# Patient Record
Sex: Female | Born: 1945 | Race: Black or African American | Hispanic: No | Marital: Married | Smoking: Former smoker
Health system: Southern US, Community
[De-identification: ages and names within clinical notes are randomized; demographics above are authoritative.]

## PROBLEM LIST (undated history)

## (undated) DIAGNOSIS — I10 Essential (primary) hypertension: Secondary | ICD-10-CM

## (undated) DIAGNOSIS — N19 Unspecified kidney failure: Secondary | ICD-10-CM

## (undated) DIAGNOSIS — I639 Cerebral infarction, unspecified: Secondary | ICD-10-CM

## (undated) HISTORY — PX: ABDOMINAL HYSTERECTOMY: SHX81

---

## 2000-02-18 ENCOUNTER — Emergency Department (HOSPITAL_COMMUNITY): Admission: EM | Admit: 2000-02-18 | Discharge: 2000-02-18 | Payer: Self-pay

## 2016-10-25 ENCOUNTER — Inpatient Hospital Stay: Admit: 2016-10-25 | Payer: Self-pay | Admitting: General Surgery

## 2016-10-25 SURGERY — LAPAROSCOPIC ROUX-EN-Y GASTRIC BYPASS WITH UPPER ENDOSCOPY
Anesthesia: General

## 2017-03-31 ENCOUNTER — Observation Stay (HOSPITAL_COMMUNITY)
Admission: EM | Admit: 2017-03-31 | Discharge: 2017-04-01 | Disposition: A | Discharge status: Home / Self Care | Payer: Medicare Other | Attending: Internal Medicine | Admitting: Internal Medicine

## 2017-03-31 ENCOUNTER — Emergency Department (HOSPITAL_COMMUNITY): Payer: Medicare Other

## 2017-03-31 ENCOUNTER — Encounter (HOSPITAL_COMMUNITY): Payer: Self-pay | Admitting: Nurse Practitioner

## 2017-03-31 DIAGNOSIS — M549 Dorsalgia, unspecified: Secondary | ICD-10-CM | POA: Diagnosis present

## 2017-03-31 DIAGNOSIS — N185 Chronic kidney disease, stage 5: Secondary | ICD-10-CM

## 2017-03-31 DIAGNOSIS — I120 Hypertensive chronic kidney disease with stage 5 chronic kidney disease or end stage renal disease: Secondary | ICD-10-CM | POA: Diagnosis not present

## 2017-03-31 DIAGNOSIS — I160 Hypertensive urgency: Principal | ICD-10-CM | POA: Insufficient documentation

## 2017-03-31 DIAGNOSIS — R778 Other specified abnormalities of plasma proteins: Secondary | ICD-10-CM | POA: Insufficient documentation

## 2017-03-31 DIAGNOSIS — Z992 Dependence on renal dialysis: Secondary | ICD-10-CM | POA: Diagnosis not present

## 2017-03-31 DIAGNOSIS — N186 End stage renal disease: Secondary | ICD-10-CM | POA: Diagnosis present

## 2017-03-31 DIAGNOSIS — S22060A Wedge compression fracture of T7-T8 vertebra, initial encounter for closed fracture: Secondary | ICD-10-CM | POA: Insufficient documentation

## 2017-03-31 DIAGNOSIS — Z7982 Long term (current) use of aspirin: Secondary | ICD-10-CM | POA: Diagnosis not present

## 2017-03-31 DIAGNOSIS — K8020 Calculus of gallbladder without cholecystitis without obstruction: Secondary | ICD-10-CM | POA: Insufficient documentation

## 2017-03-31 DIAGNOSIS — K5730 Diverticulosis of large intestine without perforation or abscess without bleeding: Secondary | ICD-10-CM | POA: Diagnosis not present

## 2017-03-31 DIAGNOSIS — Z79899 Other long term (current) drug therapy: Secondary | ICD-10-CM | POA: Insufficient documentation

## 2017-03-31 DIAGNOSIS — R7989 Other specified abnormal findings of blood chemistry: Secondary | ICD-10-CM

## 2017-03-31 DIAGNOSIS — D631 Anemia in chronic kidney disease: Secondary | ICD-10-CM | POA: Diagnosis not present

## 2017-03-31 DIAGNOSIS — I10 Essential (primary) hypertension: Secondary | ICD-10-CM | POA: Diagnosis present

## 2017-03-31 DIAGNOSIS — Z8673 Personal history of transient ischemic attack (TIA), and cerebral infarction without residual deficits: Secondary | ICD-10-CM | POA: Insufficient documentation

## 2017-03-31 DIAGNOSIS — I313 Pericardial effusion (noninflammatory): Secondary | ICD-10-CM | POA: Diagnosis not present

## 2017-03-31 DIAGNOSIS — Z90710 Acquired absence of both cervix and uterus: Secondary | ICD-10-CM | POA: Insufficient documentation

## 2017-03-31 DIAGNOSIS — X58XXXS Exposure to other specified factors, sequela: Secondary | ICD-10-CM | POA: Diagnosis not present

## 2017-03-31 DIAGNOSIS — I2510 Atherosclerotic heart disease of native coronary artery without angina pectoris: Secondary | ICD-10-CM | POA: Insufficient documentation

## 2017-03-31 DIAGNOSIS — Z7902 Long term (current) use of antithrombotics/antiplatelets: Secondary | ICD-10-CM | POA: Insufficient documentation

## 2017-03-31 DIAGNOSIS — I700 Atherosclerosis of aorta: Secondary | ICD-10-CM | POA: Diagnosis not present

## 2017-03-31 DIAGNOSIS — Z87891 Personal history of nicotine dependence: Secondary | ICD-10-CM | POA: Diagnosis not present

## 2017-03-31 DIAGNOSIS — I083 Combined rheumatic disorders of mitral, aortic and tricuspid valves: Secondary | ICD-10-CM | POA: Diagnosis not present

## 2017-03-31 DIAGNOSIS — R748 Abnormal levels of other serum enzymes: Secondary | ICD-10-CM | POA: Diagnosis present

## 2017-03-31 DIAGNOSIS — M546 Pain in thoracic spine: Secondary | ICD-10-CM

## 2017-03-31 HISTORY — DX: Essential (primary) hypertension: I10

## 2017-03-31 HISTORY — DX: Cerebral infarction, unspecified: I63.9

## 2017-03-31 HISTORY — DX: Unspecified kidney failure: N19

## 2017-03-31 LAB — BASIC METABOLIC PANEL
Anion gap: 10 (ref 5–15)
BUN: 20 mg/dL (ref 6–20)
CO2: 29 mmol/L (ref 22–32)
Calcium: 9.7 mg/dL (ref 8.9–10.3)
Chloride: 100 mmol/L — ABNORMAL LOW (ref 101–111)
Creatinine, Ser: 3.86 mg/dL — ABNORMAL HIGH (ref 0.44–1.00)
GFR calc Af Amer: 13 mL/min — ABNORMAL LOW (ref >60)
GFR calc non Af Amer: 11 mL/min — ABNORMAL LOW (ref >60)
Glucose, Bld: 117 mg/dL — ABNORMAL HIGH (ref 65–99)
Potassium: 4.6 mmol/L (ref 3.5–5.1)
Sodium: 139 mmol/L (ref 135–145)

## 2017-03-31 LAB — CBC WITH DIFFERENTIAL/PLATELET
Basophils Absolute: 0 10*3/uL (ref 0.0–0.1)
Basophils Relative: 0 %
Eosinophils Absolute: 0.4 10*3/uL (ref 0.0–0.7)
Eosinophils Relative: 4 %
HCT: 28.1 % — ABNORMAL LOW (ref 36.0–46.0)
Hemoglobin: 9.5 g/dL — ABNORMAL LOW (ref 12.0–15.0)
Lymphocytes Relative: 23 %
Lymphs Abs: 2.2 10*3/uL (ref 0.7–4.0)
MCH: 30.3 pg (ref 26.0–34.0)
MCHC: 33.8 g/dL (ref 30.0–36.0)
MCV: 89.5 fL (ref 78.0–100.0)
Monocytes Absolute: 1 10*3/uL (ref 0.1–1.0)
Monocytes Relative: 10 %
Neutro Abs: 6 10*3/uL (ref 1.7–7.7)
Neutrophils Relative %: 63 %
Platelets: 191 10*3/uL (ref 150–400)
RBC: 3.14 MIL/uL — ABNORMAL LOW (ref 3.87–5.11)
RDW: 15.4 % (ref 11.5–15.5)
WBC: 9.6 10*3/uL (ref 4.0–10.5)

## 2017-03-31 MED ORDER — MORPHINE SULFATE (PF) 4 MG/ML IV SOLN
4.0000 mg | Freq: Once | INTRAVENOUS | Status: AC
  Administered 2017-03-31: 4 mg via INTRAVENOUS
  Filled 2017-03-31: qty 1

## 2017-03-31 MED ORDER — IOPAMIDOL (ISOVUE-370) INJECTION 76%
INTRAVENOUS | Status: AC
  Filled 2017-03-31: qty 100

## 2017-03-31 MED ORDER — FUROSEMIDE 40 MG PO TABS
40.0000 mg | ORAL_TABLET | Freq: Once | ORAL | Status: AC
  Administered 2017-03-31: 40 mg via ORAL
  Filled 2017-03-31: qty 1

## 2017-03-31 MED ORDER — CARVEDILOL 25 MG PO TABS
25.0000 mg | ORAL_TABLET | Freq: Once | ORAL | Status: AC
  Administered 2017-04-01: 25 mg via ORAL
  Filled 2017-03-31 (×2): qty 1

## 2017-03-31 MED ORDER — IOPAMIDOL (ISOVUE-370) INJECTION 76%
100.0000 mL | Freq: Once | INTRAVENOUS | Status: AC | PRN
  Administered 2017-03-31: 100 mL via INTRAVENOUS

## 2017-03-31 NOTE — ED Provider Notes (Signed)
[facility]-EMERGENCY DEPT Provider Note   CSN: [id number] Arrival date & time: 03/31/17  2003   History   Chief Complaint Chief Complaint  Patient presents with  . Back Pain    HPI Brooke Delacruz is a 71 y.o. female.  HPI   71 year old female with a history of hypertension, renal failure presents today with complaints of back pain.  She notes that over the last week she has had coming and going pain in her back.  She describes this as a dull ache but can be severe at times.  She notes the last several minutes and then resolves completely.  Patient denies any heavy lifting.  Patient reports this pain radiates into her left arm.  She denies any history of the same.  She reports symptoms are not made worse with any upper strengthening her torso movements, deep inspiration, or positioning.  She denies any radiation into her chest or abdomen, denies any associated shortness of breath.   Patient reports a history of aortic valve problems, but is unable to provide significant details surrounding this.  She denies any history of cardiac dysfunction other than valvular, reports that she did have a minor stroke previously.  She reports her hypertension has been "up and down", and is currently being managed by her dialysis center.  She reports she is a Monday Wednesday Friday patient had dialysis today.   Past Medical History:  Diagnosis Date  . Hypertension   . Renal failure     There are no active problems to display for this patient.   Past Surgical History:  Procedure Laterality Date  . ABDOMINAL HYSTERECTOMY      OB History    No data available       Home Medications    Prior to Admission medications   Not on File    Family History No family history on file.  Social History Social History  Substance Use Topics  . Smoking status: Never Smoker  . Smokeless tobacco: Not on file  . Alcohol use Yes     Allergies   Patient has no known allergies.   Review of  Systems Review of Systems  All other systems reviewed and are negative.    Physical Exam Updated Vital Signs BP (!) 220/40 (BP Location: Right Arm)   Pulse 70   Temp 97.7 F (36.5 C) (Oral)   Resp 18   SpO2 98%   Physical Exam  Constitutional: She is oriented to person, place, and time. She appears well-developed and well-nourished.  HENT:  Head: Normocephalic and atraumatic.  Eyes: Conjunctivae are normal. Pupils are equal, round, and reactive to light. Right eye exhibits no discharge. Left eye exhibits no discharge. No scleral icterus.  Neck: Normal range of motion. No JVD present. No tracheal deviation present.  Cardiovascular: Normal rate and regular rhythm.   Murmur heard. Upper extremity pulses intact, right greater than left; systolic murmur  Pulmonary/Chest: Effort normal and breath sounds normal. No stridor. No respiratory distress. She has no wheezes. She has no rales. She exhibits no tenderness.  Abdominal: Soft. She exhibits no distension and no mass. There is no tenderness. There is no rebound and no guarding. No hernia.  Musculoskeletal:  No CT or L-spine tenderness to palpation.  No pain to the cervical spine with axial loading.  Surrounding musculature nontender to palpation full active range of motion of upper extremities.  Neurological: She is alert and oriented to person, place, and time. Coordination normal.  Psychiatric: She has a  normal mood and affect. Her behavior is normal. Judgment and thought content normal.  Nursing note and vitals reviewed.    ED Treatments / Results  Labs (all labs ordered are listed, but only abnormal results are displayed) Labs Reviewed  CBC WITH DIFFERENTIAL/PLATELET - Abnormal; Notable for the following:       Result Value   RBC 3.14 (*)    Hemoglobin 9.5 (*)    HCT 28.1 (*)    All other components within normal limits  BASIC METABOLIC PANEL - Abnormal; Notable for the following:    Chloride 100 (*)    Glucose, Bld 117  (*)    Creatinine, Ser 3.86 (*)    GFR calc non Af Amer 11 (*)    GFR calc Af Amer 13 (*)    All other components within normal limits  I-STAT TROPOININ, ED    EKG  EKG Interpretation  Date/Time:  Friday March 31 2017 23:46:56 EDT Ventricular Rate:  62 PR Interval:    QRS Duration: 88 QT Interval:  434 QTC Calculation: 441 R Axis:   64 Text Interpretation:  Sinus rhythm Consider left ventricular hypertrophy No old tracing to compare Confirmed by Rolan Bucco [phone]) on 03/31/2017 11:51:21 PM       Radiology No results found.  Procedures Procedures (including critical care time)  Medications Ordered in ED Medications  carvedilol (COREG) tablet 25 mg (not administered)  iopamidol (ISOVUE-370) 76 % injection (not administered)  furosemide (LASIX) tablet 40 mg (40 mg Oral Given 03/31/17 2333)  morphine 4 MG/ML injection 4 mg (4 mg Intravenous Given 03/31/17 2333)  iopamidol (ISOVUE-370) 76 % injection 100 mL (100 mLs Intravenous Contrast Given 03/31/17 2349)     Initial Impression / Assessment and Plan / ED Course  I have reviewed the triage vital signs and the nursing notes.  Pertinent labs & imaging results that were available during my care of the patient were reviewed by me and considered in my medical decision making (see chart for details).  Clinical Course as of Apr 01 2  Fri Mar 31, 2017  2334 Plan: labs, CTA chest.  If labs, ECG and Ct are negative she can be d/c home.    [HM]    Clinical Course User Index [HM] Muthersbaugh, Dahlia Client, PA-C     Final Clinical Impressions(s) / ED Diagnoses   Final diagnoses:  Acute midline thoracic back pain    71 year old female presents today with complaints of back pain.  She was initially asymptomatic at the time of evaluation, but did have an episode while here in the ED that caused her to be tearful.  She will be given a dose of morphine for this.  Patient has not taken her home hypertensive medications, she will be  given these.  Patient has had troubles managing her blood pressure at home which has resulted in her renal failure.  Patient's pain does not seem musculoskeletal in nature, low suspicion for pulmonary, low suspicion for ACS or PE.  Patient will have CT scan of the chest abdomen and pelvis for dissection.  Patient will be signed out to oncoming provider pending dissection study and disposition.  New Prescriptions New Prescriptions   No medications on file     Rosalio Loud 04/01/17 0003    Rolan Bucco, MD 04/01/17 [phone]

## 2017-03-31 NOTE — ED Triage Notes (Signed)
Pt is c/o upper back pain/scapula aspect of her back that she describes as dull and radiating to bilateral upper extremities. Denies any sort of trauma, reports onset on a week from today.

## 2017-03-31 NOTE — ED Provider Notes (Signed)
Care assumed from [place], PA-C and Rolan Bucco, MD.  Brooke Delacruz is a 71 y.o. female presents from out of town with a hx of renal failure (MWF dialysis - done today without complication), CVA, systolic murmur c/o intermittent mid thoracic back pain that radiates into her left shoulder and arm.  Pt is hypertensive here, with hx of the same.  Also hx of valve abnormality.  No hx of chrnic back pain.   Physical Exam  BP (!) 235/55 (BP Location: Left Arm)   Pulse 70   Temp 97.7 F (36.5 C) (Oral)   Resp 18   SpO2 98%   Physical Exam  Constitutional: She appears well-developed and well-nourished.  Uncomfortable appearing  HENT:  Head: Normocephalic.  Eyes: Conjunctivae are normal. No scleral icterus.  Neck: Normal range of motion.  Cardiovascular: Normal rate and intact distal pulses.   Pulmonary/Chest: Effort normal.  Musculoskeletal: Normal range of motion.  Neurological: She is alert.  Skin: Skin is warm and dry.  Nursing note and vitals reviewed.     EKG Interpretation  Date/Time:  Friday March 31 2017 23:46:56 EDT Ventricular Rate:  62 PR Interval:    QRS Duration: 88 QT Interval:  434 QTC Calculation: 441 R Axis:   64 Text Interpretation:  Sinus rhythm Consider left ventricular hypertrophy No old tracing to compare Confirmed by Rolan Bucco [phone]) on 03/31/2017 11:51:21 PM      Results for orders placed or performed during the hospital encounter of 03/31/17  CBC with Differential  Result Value Ref Range   WBC 9.6 4.0 - 10.5 K/uL   RBC 3.14 (L) 3.87 - 5.11 MIL/uL   Hemoglobin 9.5 (L) 12.0 - 15.0 g/dL   HCT 60.4 (L) 54.0 - 98.1 %   MCV 89.5 78.0 - 100.0 fL   MCH 30.3 26.0 - 34.0 pg   MCHC 33.8 30.0 - 36.0 g/dL   RDW 19.1 47.8 - 29.5 %   Platelets 191 150 - 400 K/uL   Neutrophils Relative % 63 %   Neutro Abs 6.0 1.7 - 7.7 K/uL   Lymphocytes Relative 23 %   Lymphs Abs 2.2 0.7 - 4.0 K/uL   Monocytes Relative 10 %   Monocytes Absolute 1.0 0.1 - 1.0 K/uL    Eosinophils Relative 4 %   Eosinophils Absolute 0.4 0.0 - 0.7 K/uL   Basophils Relative 0 %   Basophils Absolute 0.0 0.0 - 0.1 K/uL  Basic metabolic panel  Result Value Ref Range   Sodium 139 135 - 145 mmol/L   Potassium 4.6 3.5 - 5.1 mmol/L   Chloride 100 (L) 101 - 111 mmol/L   CO2 29 22 - 32 mmol/L   Glucose, Bld 117 (H) 65 - 99 mg/dL   BUN 20 6 - 20 mg/dL   Creatinine, Ser 6.21 (H) 0.44 - 1.00 mg/dL   Calcium 9.7 8.9 - 30.8 mg/dL   GFR calc non Af Amer 11 (L) >60 mL/min   GFR calc Af Amer 13 (L) >60 mL/min   Anion gap 10 5 - 15  I-Stat Troponin, ED (not at [facility])  Result Value Ref Range   Troponin i, poc 0.10 (HH) 0.00 - 0.08 ng/mL   Comment NOTIFIED PHYSICIAN    Comment 3           Ct Angio Chest/abd/pel For Dissection W And/or Wo Contrast  Result Date: 04/01/2017 CLINICAL DATA:  Back pain for 1 week. Patient with chronic renal failure on dialysis, most recently this morning.  EXAM: CT ANGIOGRAPHY CHEST, ABDOMEN AND PELVIS TECHNIQUE: Multidetector CT imaging through the chest, abdomen and pelvis was performed using the standard protocol during bolus administration of intravenous contrast. Multiplanar reconstructed images and MIPs were obtained and reviewed to evaluate the vascular anatomy. CONTRAST:  100 cc Isovue 370 IV COMPARISON:  None.  No prior imaging available. FINDINGS: CTA CHEST FINDINGS Cardiovascular: Atherosclerosis of thoracic aorta without dissection, aneurysm or perihepatic hematoma. Cardiac motion artifact partially obscures detailed evaluation. No central pulmonary embolus. Multi chamber cardiomegaly. Moderate pericardial effusion adjacent to the left ventricle measures up to 27 mm. There are coronary artery calcifications. Mediastinum/Nodes: No mediastinal or hilar adenopathy. The esophagus is decompressed. Thyroid gland is heterogeneous. Lungs/Pleura: Linear atelectasis in the right lower and middle lobes. Compressive atelectasis in the left lower lobe adjacent to  pericardial fluid. No consolidation. No pulmonary edema. No pleural effusion. Musculoskeletal: Degenerative disc disease in the thoracic spine. Mild anterior wedging of T7 vertebral body with depression of the superior endplate. Loss of height loss in 20%. Sternum and ribs are intact. Review of the MIP images confirms the above findings. CTA ABDOMEN AND PELVIS FINDINGS VASCULAR Aorta: Normal caliber aorta without aneurysm, dissection, vasculitis or significant stenosis. Moderate diffuse atherosclerosis. Celiac: Minimal stenosis at the origins secondary to plaque. No evidence of aneurysm caught vasculitis or dissection. SMA: Patent without evidence of aneurysm, dissection, vasculitis or significant stenosis. Moderate diffuse atherosclerosis. Renals: Stent at the origin of the left renal artery. Two right renal arteries that are patent. IMA: Patent without evidence of aneurysm, dissection, vasculitis or significant stenosis. Inflow: Diffuse atheromatous plaque. No aneurysm or dissection. Narrowing at the bilateral common femoral artery is. Bilateral common femoral stents are partially included and not well evaluated. Veins: No obvious venous abnormality within the limitations of this arterial phase study. Review of the MIP images confirms the above findings. NON-VASCULAR Hepatobiliary: No focal hepatic lesion allowing for arterial phase enhancement. Gallstones within physiologically distended gallbladder. No biliary dilatation. Pancreas: Parenchymal atrophy. No ductal dilatation or inflammation. Spleen: Normal arterial phase enhancement. Adrenals/Urinary Tract: No adrenal nodule. Atrophic bilateral renal parenchyma, patient on hemodialysis. There is a 2.1 cm cyst in the mid right kidney. Heterogeneous left kidney likely due to small cysts. Urinary bladder is physiologically distended. Stomach/Bowel: Colonic diverticulosis without acute inflammation. No bowel obstruction. Appendix is not visualized. Questionable  enteric sutures in the central small bowel. Lymphatic: No adenopathy. Reproductive: Uterus is surgically absent. Question of cystic changes in both adnexa, measuring up to 5 cm on the right and 3 cm on the left. Other: No ascites. No free air. Postsurgical change in the anterior abdominal wall. Musculoskeletal: Degenerative change in the lumbar spine without acute abnormality. Review of the MIP images confirms the above findings. IMPRESSION: 1. No aortic dissection or acute aortic abnormality. Diffuse atheromatous calcifications throughout the thoracic and abdominal aorta. 2. Minimal anterior wedge compression fracture of T7 vertebral body, age indeterminate, may be cause of patient's back pain. 3. Cardiomegaly with moderate pericardial effusion. Coronary artery calcifications. 4. Uncomplicated cholelithiasis. 5. Cystic changes in both adnexa, measuring up to 5 cm on the right and 3 cm on the left. Recommend nonemergent characterization with ultrasound on an elective basis. Comparison with any prior imaging if available may be helpful if available to evaluate for imaging stability. 6. Colonic diverticulosis. Aortic Atherosclerosis (ICD10-I70.0). Electronically Signed   By: Rubye OaksMelanie  Ehinger M.D.   On: 04/01/2017 00:21     ED Course  Procedures  Clinical Course as of Mar 31 2336  Fri Mar 31, 2017  2334 Plan: labs, CTA chest.  If labs, ECG and Ct are negative she can be d/c home.    [HM]    Clinical Course User Index [HM] Avani Sensabaugh, Dahlia Client, PA-C    MDM Pt with continued HTN, back pain and now with elevated troponin.  This may be baseline for pt, but there are no old records for comparison.  Discussed with Dr. Maryfrances Bunnell who will admit.           Zebbie Ace, Boyd Kerbs 04/01/17 0121    Rolan Bucco, MD 04/01/17 [phone]

## 2017-03-31 NOTE — ED Notes (Signed)
Pt is alert and oriented x 4 and is verbally responsive. Pt reports having back pain x 1 week that is described as a dull ache that is intermittent. Pt states that she has not taking any medication for pain. Pt denies pain at this time. Pt states that he gerts HD 3 x a week.

## 2017-04-01 ENCOUNTER — Encounter (HOSPITAL_COMMUNITY): Payer: Self-pay | Admitting: Family Medicine

## 2017-04-01 ENCOUNTER — Observation Stay (HOSPITAL_BASED_OUTPATIENT_CLINIC_OR_DEPARTMENT_OTHER): Payer: Medicare Other

## 2017-04-01 DIAGNOSIS — R778 Other specified abnormalities of plasma proteins: Secondary | ICD-10-CM | POA: Diagnosis present

## 2017-04-01 DIAGNOSIS — I360 Nonrheumatic tricuspid (valve) stenosis: Secondary | ICD-10-CM

## 2017-04-01 DIAGNOSIS — Z992 Dependence on renal dialysis: Secondary | ICD-10-CM

## 2017-04-01 DIAGNOSIS — M549 Dorsalgia, unspecified: Secondary | ICD-10-CM

## 2017-04-01 DIAGNOSIS — R748 Abnormal levels of other serum enzymes: Secondary | ICD-10-CM

## 2017-04-01 DIAGNOSIS — I313 Pericardial effusion (noninflammatory): Secondary | ICD-10-CM

## 2017-04-01 DIAGNOSIS — N185 Chronic kidney disease, stage 5: Secondary | ICD-10-CM | POA: Diagnosis not present

## 2017-04-01 DIAGNOSIS — D631 Anemia in chronic kidney disease: Secondary | ICD-10-CM | POA: Diagnosis not present

## 2017-04-01 DIAGNOSIS — I10 Essential (primary) hypertension: Secondary | ICD-10-CM | POA: Diagnosis not present

## 2017-04-01 DIAGNOSIS — I160 Hypertensive urgency: Secondary | ICD-10-CM | POA: Diagnosis not present

## 2017-04-01 DIAGNOSIS — N186 End stage renal disease: Secondary | ICD-10-CM | POA: Diagnosis not present

## 2017-04-01 DIAGNOSIS — R7989 Other specified abnormal findings of blood chemistry: Secondary | ICD-10-CM

## 2017-04-01 DIAGNOSIS — M546 Pain in thoracic spine: Secondary | ICD-10-CM | POA: Diagnosis not present

## 2017-04-01 LAB — CBC WITH DIFFERENTIAL/PLATELET
BASOS PCT: 0 %
Basophils Absolute: 0 10*3/uL (ref 0.0–0.1)
EOS ABS: 0.2 10*3/uL (ref 0.0–0.7)
EOS PCT: 3 %
HCT: 24.7 % — ABNORMAL LOW (ref 36.0–46.0)
Hemoglobin: 8.5 g/dL — ABNORMAL LOW (ref 12.0–15.0)
LYMPHS ABS: 0.5 10*3/uL — AB (ref 0.7–4.0)
Lymphocytes Relative: 7 %
MCH: 30.8 pg (ref 26.0–34.0)
MCHC: 34.4 g/dL (ref 30.0–36.0)
MCV: 89.5 fL (ref 78.0–100.0)
MONO ABS: 0.1 10*3/uL (ref 0.1–1.0)
MONOS PCT: 1 %
Neutro Abs: 5.8 10*3/uL (ref 1.7–7.7)
Neutrophils Relative %: 89 %
Platelets: 174 10*3/uL (ref 150–400)
RBC: 2.76 MIL/uL — ABNORMAL LOW (ref 3.87–5.11)
RDW: 15.4 % (ref 11.5–15.5)
WBC: 6.5 10*3/uL (ref 4.0–10.5)

## 2017-04-01 LAB — COMPREHENSIVE METABOLIC PANEL
ALBUMIN: 3.5 g/dL (ref 3.5–5.0)
ALT: 8 U/L — ABNORMAL LOW (ref 14–54)
ANION GAP: 10 (ref 5–15)
AST: 15 U/L (ref 15–41)
Alkaline Phosphatase: 84 U/L (ref 38–126)
BUN: 23 mg/dL — ABNORMAL HIGH (ref 6–20)
CALCIUM: 9.8 mg/dL (ref 8.9–10.3)
CO2: 27 mmol/L (ref 22–32)
Chloride: 98 mmol/L — ABNORMAL LOW (ref 101–111)
Creatinine, Ser: 4.93 mg/dL — ABNORMAL HIGH (ref 0.44–1.00)
GFR calc non Af Amer: 8 mL/min — ABNORMAL LOW (ref >60)
GFR, EST AFRICAN AMERICAN: 9 mL/min — AB (ref >60)
GLUCOSE: 98 mg/dL (ref 65–99)
POTASSIUM: 4.3 mmol/L (ref 3.5–5.1)
SODIUM: 135 mmol/L (ref 135–145)
Total Bilirubin: 0.6 mg/dL (ref 0.3–1.2)
Total Protein: 6.6 g/dL (ref 6.5–8.1)

## 2017-04-01 LAB — ECHOCARDIOGRAM COMPLETE
HEIGHTINCHES: 60 in
WEIGHTICAEL: 2405.66 [oz_av]

## 2017-04-01 LAB — TROPONIN I
TROPONIN I: 0.03 ng/mL — AB (ref <0.03)
Troponin I: 0.03 ng/mL (ref <0.03)

## 2017-04-01 LAB — I-STAT TROPONIN, ED: Troponin i, poc: 0.1 ng/mL (ref 0.00–0.08)

## 2017-04-01 LAB — PHOSPHORUS: PHOSPHORUS: 4.9 mg/dL — AB (ref 2.5–4.6)

## 2017-04-01 LAB — MAGNESIUM: Magnesium: 2 mg/dL (ref 1.7–2.4)

## 2017-04-01 MED ORDER — FUROSEMIDE 20 MG PO TABS
50.0000 mg | ORAL_TABLET | Freq: Two times a day (BID) | ORAL | Status: DC
  Administered 2017-04-01: 11:00:00 50 mg via ORAL
  Filled 2017-04-01: qty 1

## 2017-04-01 MED ORDER — ROSUVASTATIN CALCIUM 20 MG PO TABS: 20.0000 mg | ORAL_TABLET | Freq: Every day | ORAL | Status: DC

## 2017-04-01 MED ORDER — CALCIUM CARBONATE 1250 (500 CA) MG PO TABS: 1250.0000 mg | ORAL_TABLET | Freq: Every day | ORAL | Status: DC

## 2017-04-01 MED ORDER — SPIRONOLACTONE 25 MG PO TABS
50.0000 mg | ORAL_TABLET | Freq: Every day | ORAL | Status: DC
  Administered 2017-04-01: 50 mg via ORAL
  Filled 2017-04-01 (×2): qty 2

## 2017-04-01 MED ORDER — NIFEDIPINE ER OSMOTIC RELEASE 30 MG PO TB24
30.0000 mg | ORAL_TABLET | Freq: Every day | ORAL | Status: DC
  Administered 2017-04-01: 30 mg via ORAL
  Filled 2017-04-01: qty 1

## 2017-04-01 MED ORDER — OXYCODONE-ACETAMINOPHEN 5-325 MG PO TABS
1.0000 | ORAL_TABLET | ORAL | Status: DC | PRN
  Administered 2017-04-01: 2 via ORAL
  Administered 2017-04-01: 1 via ORAL
  Filled 2017-04-01: qty 2
  Filled 2017-04-01: qty 1

## 2017-04-01 MED ORDER — ISOSORBIDE MONONITRATE ER 60 MG PO TB24
60.0000 mg | ORAL_TABLET | Freq: Every day | ORAL | Status: DC
  Administered 2017-04-01: 60 mg via ORAL
  Filled 2017-04-01 (×2): qty 1

## 2017-04-01 MED ORDER — ASPIRIN EC 81 MG PO TBEC: 81.0000 mg | DELAYED_RELEASE_TABLET | Freq: Every day | ORAL | Status: DC

## 2017-04-01 MED ORDER — CLOPIDOGREL BISULFATE 75 MG PO TABS
75.0000 mg | ORAL_TABLET | Freq: Every day | ORAL | Status: DC
  Administered 2017-04-01: 75 mg via ORAL
  Filled 2017-04-01: qty 1

## 2017-04-01 MED ORDER — CARVEDILOL 25 MG PO TABS
50.0000 mg | ORAL_TABLET | Freq: Two times a day (BID) | ORAL | Status: DC
  Administered 2017-04-01: 50 mg via ORAL
  Filled 2017-04-01: qty 2

## 2017-04-01 MED ORDER — SODIUM CHLORIDE 0.9% FLUSH
3.0000 mL | Freq: Two times a day (BID) | INTRAVENOUS | Status: DC
  Administered 2017-04-01 (×2): 3 mL via INTRAVENOUS

## 2017-04-01 MED ORDER — IRBESARTAN 300 MG PO TABS
300.0000 mg | ORAL_TABLET | Freq: Every day | ORAL | Status: DC
  Administered 2017-04-01: 300 mg via ORAL
  Filled 2017-04-01 (×2): qty 1

## 2017-04-01 MED ORDER — HEPARIN SODIUM (PORCINE) 5000 UNIT/ML IJ SOLN
5000.0000 [IU] | Freq: Three times a day (TID) | INTRAMUSCULAR | Status: DC
  Administered 2017-04-01: 5000 [IU] via SUBCUTANEOUS
  Filled 2017-04-01: qty 1

## 2017-04-01 MED ORDER — OXYCODONE-ACETAMINOPHEN 5-325 MG PO TABS: 1.0000 | ORAL_TABLET | Freq: Four times a day (QID) | ORAL | 0 refills | Status: AC | PRN

## 2017-04-01 MED ORDER — POLYETHYLENE GLYCOL 3350 17 G PO PACK
17.0000 g | PACK | ORAL | Status: DC
  Administered 2017-04-01: 17 g via ORAL
  Filled 2017-04-01: qty 1

## 2017-04-01 MED ORDER — MAGNESIUM OXIDE 400 (241.3 MG) MG PO TABS
400.0000 mg | ORAL_TABLET | Freq: Two times a day (BID) | ORAL | Status: DC
  Administered 2017-04-01 (×2): 400 mg via ORAL
  Filled 2017-04-01 (×2): qty 1

## 2017-04-01 NOTE — ED Notes (Signed)
Notified EDP,Campos,MD., pt. I-stat troponin results 0.10 and RN,Celeste made aware.

## 2017-04-01 NOTE — Progress Notes (Signed)
  Echocardiogram 2D Echocardiogram has been performed.  Brooke Delacruz 04/01/2017, 1:59 PM

## 2017-04-01 NOTE — Discharge Summary (Signed)
Physician Discharge Summary  Brooke Delacruz ZOX:[id number] DOB: 08/06/1946 DOA: 03/31/2017  PCP: Patient, No Pcp Per  Admit date: 03/31/2017 Discharge date: 04/01/2017  Admitted From: Home Disposition: Home  Recommendations for Outpatient Follow-up:  1. Follow up with PCP in 1-2 weeks 2. Follow up with Cardiology as an outpatient for evaluation and management of BP as well as Pericardial Effusion likely from ESRD 3. Follow up with Nephrology as an outpatient for Maintainence of Hemodialysis 4. Please obtain CMP/CBC, Mag, Phos in one week  Home Health: No Equipment/Devices: None  Discharge Condition: Stable  CODE STATUS: FULL CODE Diet recommendation: Cardiac Prudent Renal Dialysis Diet  Brief/Interim Summary: HPI: Brooke DouglasCynthia Pecora is a 71 y.o. female with a past medical history significant for ESRD on HD MWF, HTN, hx of CVA who presented with back pain for one week. The patient was in her usual state of health until about a week ago when she developed a pain in her upper back. This was intermittent, dull in character, across her upper back, worse with movement or lying down on.  Worse a little with bending forward.  Not exertional. It radiated into both arms. Today she had her normal, full dialysis treatment, and [place].  At dinner, however, the pain returned, and she seemed uncomfortable, and so family took her to the ER.There was no chest pain, diaphoresis, apprehension, nausea, dyspnea. She was admitted for Elevated Troponin, Hypertensive Urgency, and Back Pain. She steadily improved and was worked up and ruled out and ECHOCardiogram was done. She was deemed medically stable at this point to D/C home and she will need to follow up with PCP and Cardiology as an outpatient as well as Nephrology for the Maintenance of her Hemodialysis.   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 Hypertensive urgency, improved -Has baseline 6 meds for BP, in setting of ESRD.  Reports no missed HD recently, nor any symptoms of chest pain, dyspnea, chest congestion, swelling.  -?SBP related to pain.   -Also notably wide PP in this elderly female with diffusely calcified arteries.   -Restarted orals:   ARB, Nifedipine XL, spironolactone, Imdur -Continue carvedilol, furosemide given in ER -Ensure adequate pain relief -Could trial oral hydralazine if trops normal and above measures fail -Follow up with PCP and Cardiology as an outpatient  2. Elevated troponin:  -No chest pain, elevation in the setting of ESRD and Hypertensive Urgency -Unlikely to be significant and not consistent with ACS -Trended troponin and was Flat -ECHOCARDIOGRAM Done and Estimated EF was 60-65%  3. Back pain, improved  -Minimal anterior wedge compression fracture of T7 vertebral body, age indeterminate, may be cause of patient's back pain -Continue home Percocet -Gave a small refill of Percocet on D/C -Follow up with PCP and have them refer to spine for Kyphoplasty if pain worsens or affects ADLs  4. Adnexal cysts in both Adnexa:  -Noted on CT Angio Chest/Abd/Pelvis -Cystic changes in both adnexa, measuring up to 5 cm on the right and 3 cm on the left. Recommend nonemergent characterization with ultrasound on an elective bas -Will need Outpatient follow up ultrasound per PCP  5. Cerebrovascular disease secondary prevention:  -Continue Plavix, Crestor, Aspirin  6. Moderate Pericardial Effusion -Likely from  ESRD -No Tamponade -Follow up with Cardiology as an Outpatient   Discharge Instructions  Discharge Instructions    Call MD for:  difficulty breathing, headache or visual disturbances    Complete by:  As directed    Call MD for:   extreme fatigue    Complete by:  As directed    Call MD for:  hives    Complete by:  As directed    Call MD for:  persistant dizziness or light-headedness    Complete by:  As directed    Call MD for:  persistant nausea and vomiting    Complete by:  As directed    Call MD for:  severe uncontrolled pain    Complete by:  As directed    Call MD for:  temperature >100.4    Complete by:  As directed    Diet - low sodium heart healthy    Complete by:  As directed    Discharge instructions    Complete by:  As directed    Follow up with PCP and with Cardiology as an outpatient. Take all medications as prescribed. If symptoms change or worsen please return to the ED For evaluation.   Increase activity slowly    Complete by:  As directed      Allergies as of 04/01/2017   No Known Allergies     Medication List    TAKE these medications   aspirin EC 81 MG tablet Take 81 mg by mouth at bedtime.   BLACK CHERRY CONCENTRATE PO Take 2 capsules by mouth at bedtime.   calcium carbonate 1250 (500 Ca) MG chewable tablet Commonly known as:  OS-CAL Chew 1 tablet by mouth at bedtime.   carvedilol 25 MG tablet Commonly known as:  COREG Take 50 mg by mouth 2 (two) times daily with a meal.   clopidogrel 75 MG tablet Commonly known as:  PLAVIX Take 75 mg by mouth daily.   febuxostat 40 MG tablet Commonly known as:  ULORIC Take 40 mg by mouth daily.   furosemide 20 MG tablet Commonly known as:  LASIX Take 50 mg by mouth 2 (two) times daily.   isosorbide mononitrate 60 MG 24 hr tablet Commonly known as:  IMDUR Take 60 mg by mouth daily.   magnesium oxide 400 (241.3 Mg) MG tablet Commonly known as:  MAG-OX Take 400 mg by mouth 2 (two) times daily.   NIFEdipine 30 MG 24 hr tablet Commonly known as:  PROCARDIA-XL/ADALAT-CC/NIFEDICAL-XL Take 30 mg by mouth daily.   oxybutynin 5 MG tablet Commonly known as:  DITROPAN Take 5 mg by mouth at bedtime.   oxyCODONE-acetaminophen 5-325 MG  tablet Commonly known as:  PERCOCET/ROXICET Take 1-2 tablets by mouth every 4 (four) hours as needed for severe pain.   polyethylene glycol packet Commonly known as:  MIRALAX / GLYCOLAX Take 17 g by mouth every other day.   ranitidine 150 MG tablet Commonly known as:  ZANTAC Take 150 mg by mouth at bedtime.   rosuvastatin 20 MG tablet Commonly known as:  CRESTOR Take 20 mg by mouth at bedtime.   spironolactone 50 MG tablet Commonly known as:  ALDACTONE Take 50 mg by mouth daily.   telmisartan 80 MG tablet Commonly known as:  MICARDIS Take 80 mg by mouth daily.   Vitamin D (Ergocalciferol) 50000 units Caps capsule Commonly known as:  DRISDOL Take 50,000 Units by mouth every Sunday.       No Known Allergies  Consultations:  None  Procedures/Studies: Ct Angio Chest/abd/pel For Dissection W And/or Wo Contrast  Result Date: 04/01/2017 CLINICAL DATA:  Back pain for 1 week. Patient with chronic renal failure on dialysis, most recently this morning. EXAM: CT ANGIOGRAPHY CHEST, ABDOMEN AND PELVIS TECHNIQUE: Multidetector CT imaging through the chest, abdomen and pelvis was performed using the standard protocol during bolus administration of intravenous contrast. Multiplanar reconstructed images and MIPs were obtained and reviewed to evaluate the vascular anatomy. CONTRAST:  100 cc Isovue 370 IV COMPARISON:  None.  No prior imaging available. FINDINGS: CTA CHEST FINDINGS Cardiovascular: Atherosclerosis of thoracic aorta without dissection, aneurysm or perihepatic hematoma. Cardiac motion artifact partially obscures detailed evaluation. No central pulmonary embolus. Multi chamber cardiomegaly. Moderate pericardial effusion adjacent to the left ventricle measures up to 27 mm. There are coronary artery calcifications. Mediastinum/Nodes: No mediastinal or hilar adenopathy. The esophagus is decompressed. Thyroid gland is heterogeneous. Lungs/Pleura: Linear atelectasis in the right lower  and middle lobes. Compressive atelectasis in the left lower lobe adjacent to pericardial fluid. No consolidation. No pulmonary edema. No pleural effusion. Musculoskeletal: Degenerative disc disease in the thoracic spine. Mild anterior wedging of T7 vertebral body with depression of the superior endplate. Loss of height loss in 20%. Sternum and ribs are intact. Review of the MIP images confirms the above findings. CTA ABDOMEN AND PELVIS FINDINGS VASCULAR Aorta: Normal caliber aorta without aneurysm, dissection, vasculitis or significant stenosis. Moderate diffuse atherosclerosis. Celiac: Minimal stenosis at the origins secondary to plaque. No evidence of aneurysm caught vasculitis or dissection. SMA: Patent without evidence of aneurysm, dissection, vasculitis or significant stenosis. Moderate diffuse atherosclerosis. Renals: Stent at the origin of the left renal artery. Two right renal arteries that are patent. IMA: Patent without evidence of aneurysm, dissection, vasculitis or significant stenosis. Inflow: Diffuse atheromatous plaque. No aneurysm or dissection. Narrowing at the bilateral common femoral artery is. Bilateral common femoral stents are partially included and not well evaluated. Veins: No obvious venous abnormality within the limitations of this arterial phase study. Review of the MIP images confirms the above findings. NON-VASCULAR Hepatobiliary: No focal hepatic lesion allowing for arterial phase enhancement. Gallstones within physiologically distended gallbladder. No biliary dilatation. Pancreas: Parenchymal atrophy. No ductal dilatation or inflammation. Spleen: Normal arterial phase enhancement. Adrenals/Urinary Tract: No adrenal nodule. Atrophic bilateral renal parenchyma, patient on hemodialysis. There is a 2.1 cm cyst in the mid right kidney. Heterogeneous left kidney likely due to small cysts. Urinary bladder is physiologically distended. Stomach/Bowel: Colonic diverticulosis without acute  inflammation. No bowel obstruction. Appendix is not visualized. Questionable enteric sutures in the central small bowel. Lymphatic: No adenopathy. Reproductive: Uterus is surgically absent. Question of cystic changes in both adnexa, measuring up to 5 cm on the right and 3 cm on the left. Other: No ascites. No free air. Postsurgical change in the anterior abdominal wall. Musculoskeletal: Degenerative change in the lumbar spine without acute abnormality. Review of the MIP images confirms the above findings. IMPRESSION: 1. No aortic dissection or acute aortic abnormality. Diffuse atheromatous calcifications throughout the thoracic and abdominal aorta. 2. Minimal anterior wedge compression fracture of T7 vertebral body, age indeterminate, may be cause of patient's back pain. 3. Cardiomegaly with moderate pericardial effusion. Coronary artery calcifications. 4. Uncomplicated cholelithiasis. 5. Cystic changes in both adnexa, measuring up to 5 cm on the right and 3 cm on the left. Recommend nonemergent characterization with ultrasound on an elective basis. Comparison with any prior imaging if available may be helpful if available to evaluate for imaging stability. 6. Colonic diverticulosis.  Aortic Atherosclerosis (ICD10-I70.0). Electronically Signed   By: Rubye Oaks M.D.   On: 04/01/2017 00:21    ECHOCARDIOGRAM Study Conclusions  - Left ventricle: Wall thickness was increased in a pattern of mild   LVH. Systolic function was normal. The estimated ejection   fraction was in the range of 60% to 65%. Doppler parameters are   consistent with both elevated ventricular end-diastolic filling   pressure and elevated left atrial filling pressure. - Aortic valve: There was mild to moderate stenosis. There was mild   regurgitation. Valve area (VTI): 1.18 cm^2. Valve area (Vmax):   1.14 cm^2. Valve area (Vmean): 1.23 cm^2. - Mitral valve: There was moderate regurgitation. - Left atrium: The atrium was moderately  dilated. - Atrial septum: A patent foramen ovale cannot be excluded. - Tricuspid valve: There was mild-moderate regurgitation. - Pericardium, extracardiac: modertate posterior lateral   pericardial effusion no tamponade.  Subjective: Seen and examined and stated back pain improved. No nausea or vomiting. Ready to go home and denied CP or SOB.  Discharge Exam: Vitals:   04/01/17 0247 04/01/17 0524  BP: (!) 247/75 (!) 129/26  Pulse: 85 (!) 57  Resp: 14 12  Temp: 98.7 F (37.1 C) 98.4 F (36.9 C)   Vitals:   03/31/17 2334 04/01/17 0200 04/01/17 0247 04/01/17 0524  BP: (!) 220/40 (!) 225/49 (!) 247/75 (!) 129/26  Pulse:  64 85 (!) 57  Resp:  12 14 12   Temp:   98.7 F (37.1 C) 98.4 F (36.9 C)  TempSrc:   Oral Oral  SpO2:  95% 100% 96%  Weight:   68.2 kg (150 lb 5.7 oz)   Height:   5' (1.524 m)    General: Pt is alert, awake, not in acute distress Cardiovascular: Slightly bradycardic, S1/S2 +, no rubs, no gallops Respiratory: CTA bilaterally, no wheezing, no rhonchi Abdominal: Soft, NT, ND, bowel sounds + Extremities: no edema, no cyanosis; Left arm fistula with palpable thrill and auscultated bruit  The results of significant diagnostics from this hospitalization (including imaging, microbiology, ancillary and laboratory) are listed below for reference.    Microbiology: No results found for this or any previous visit (from the past 240 hour(s)).   Labs: BNP (last 3 results) No results for input(s): BNP in the last 8760 hours. Basic Metabolic Panel:  Recent Labs Lab 03/31/17 2216 04/01/17 0836  NA 139 135  K 4.6 4.3  CL 100* 98*  CO2 29 27  GLUCOSE 117* 98  BUN 20 23*  CREATININE 3.86* 4.93*  CALCIUM 9.7 9.8  MG  --  2.0  PHOS  --  4.9*   Liver Function Tests:  Recent Labs Lab 04/01/17 0836  AST 15  ALT 8*  ALKPHOS 84  BILITOT 0.6  PROT 6.6  ALBUMIN 3.5   No results for input(s): LIPASE, AMYLASE in the last 168 hours. No results for input(s):  AMMONIA in the last 168 hours. CBC:  Recent Labs Lab 03/31/17 2216 04/01/17 0836  WBC 9.6 6.5  NEUTROABS 6.0 5.8  HGB 9.5* 8.5*  HCT 28.1* 24.7*  MCV 89.5 89.5  PLT 191 174   Cardiac Enzymes:  Recent Labs Lab 04/01/17 0538 04/01/17 0836  TROPONINI 0.03* 0.03*   BNP: Invalid input(s): POCBNP CBG: No results for input(s): GLUCAP in the last 168 hours. D-Dimer No results for input(s): DDIMER in the last 72 hours. Hgb A1c No results for input(s): HGBA1C in the last 72 hours. Lipid Profile No results for input(s): CHOL, HDL,  LDLCALC, TRIG, CHOLHDL, LDLDIRECT in the last 72 hours. Thyroid function studies No results for input(s): TSH, T4TOTAL, T3FREE, THYROIDAB in the last 72 hours.  Invalid input(s): FREET3 Anemia work up No results for input(s): VITAMINB12, FOLATE, FERRITIN, TIBC, IRON, RETICCTPCT in the last 72 hours. Urinalysis No results found for: COLORURINE, APPEARANCEUR, LABSPEC, PHURINE, GLUCOSEU, HGBUR, BILIRUBINUR, KETONESUR, PROTEINUR, UROBILINOGEN, NITRITE, LEUKOCYTESUR Sepsis Labs Invalid input(s): PROCALCITONIN,  WBC,  LACTICIDVEN Microbiology No results found for this or any previous visit (from the past 240 hour(s)).  Time coordinating discharge: 35 minutes  SIGNED:  Merlene Laughter, DO [facility] 04/01/2017, 3:02 PM Pager [phone]  If 7PM-7AM, please contact night-coverage [email or web address] Password [email or web address]

## 2017-04-01 NOTE — Progress Notes (Signed)
PT Cancellation Note  Patient Details Name: Brooke DouglasCynthia Kimbrough MRN: [id number] DOB: 01/01/1946   Cancelled Treatment: Introduced myself to patient, but she says she can walk "fine"  Confirmed with nurse who states pt has been up independent in her room and was able to get dressed without assist.  Pt does not need skilled PT, eval not performed        Bayard Huggereresa K. Manson PasseyBrown, PT  Donnetta HailBrown, Bartholomew Ramesh Krall 04/01/2017, 1:22 PM

## 2017-04-01 NOTE — H&P (Signed)
History and Physical  Patient Name: Brooke Delacruz     ERX:[id number]    DOB: 07/23/1946    DOA: 03/31/2017 PCP: Patient, No Pcp Per  [place]  Chief Complaint: Back pain      HPI: Brooke DouglasCynthia Straughter is a 71 y.o. female with a past medical history significant for ESRD on HD MWF, HTN, hx of CVA who presents with back pain for one week.  The patient was in her usual state of health until about a week ago when she developed a pain in her upper back. This is intermittent, dull in character, across her upper back, worse with movement or lying down on.  Worse a little with bending forward.  Not exertional. It radiates into both arms. Today she had her normal, full dialysis treatment, and then [place] from [place] [facility] [place].  At dinner, however, the pain returned, and she seemed uncomfortable, and so family took her to the ER.  There was no chest pain, diaphoresis, apprehension, nausea, dyspnea.  ED course: -Afebrile, heart rate 70, respirations and pulse ox normal, blood pressure 220/42 -Na 139, K 4.6, Cr 3.86 (baseline unknown but known ESRD), WBC 9.6K, Hgb 9.5 -Troponin 0.1 -CTA of the chest abdomen and pelvis showed no PE, dissection or other intraabdominal abnormality except cysts of the ovaries for which outpatient follow up with US was suggested, also did show a T7 age indeterminate compression fracture -She was given furosemide and carvedilol and TRH were asked to evaluate for elevated troponin and hypertensive urgency   She has had a previous stroke, does not have a history of CAD.  No family history of CAD.  Former smoker, remote.         ROS: Review of Systems  Respiratory: Negative for cough and shortness of breath.   Cardiovascular: Negative for chest pain, palpitations, orthopnea, leg swelling and PND.  Musculoskeletal: Positive for back pain.  All other systems reviewed and are negative.          Past Medical History:  Diagnosis Date  . Hypertension   . Renal failure   . Stroke [facility](HCC)     Past Surgical History:  Procedure Laterality Date  . ABDOMINAL HYSTERECTOMY      Social History: [place]  The patient walks unassisted.  She used to work in [employer].  SHe has no dementia.  Former smoker.  No Known Allergies  Family history: family history includes Diabetes in her mother.  Prior to Admission medications   Medication Sig Start Date End Date Taking? Authorizing Provider  aspirin EC 81 MG tablet Take 81 mg by mouth at bedtime.   Yes [provider]  calcium carbonate (OS-CAL) 1250 (500 Ca) MG chewable tablet Chew 1 tablet by mouth at bedtime.   Yes [provider]  carvedilol (COREG) 25 MG tablet Take 50 mg by mouth 2 (two) times daily with a meal.    Yes [provider]  clopidogrel (PLAVIX) 75 MG tablet Take 75 mg by mouth daily.   Yes [provider]  febuxostat (ULORIC) 40 MG tablet Take 40 mg by mouth daily.   Yes [provider]  furosemide (LASIX) 20 MG tablet Take 50 mg by mouth 2 (two) times daily.   Yes [provider]  isosorbide mononitrate (IMDUR) 60 MG 24 hr tablet Take 60 mg by mouth daily.   Yes  [provider]  magnesium oxide (MAG-OX) 400 (241.3 Mg) MG tablet Take 400 mg by mouth 2 (two) times daily.   Yes [provider]  Misc Natural Products (BLACK CHERRY CONCENTRATE PO) Take 2 capsules by mouth at bedtime.   Yes [provider]  NIFEdipine (PROCARDIA-XL/ADALAT-CC/NIFEDICAL-XL) 30 MG 24 hr tablet Take 30 mg by mouth daily.   Yes [provider]  oxybutynin (DITROPAN) 5 MG tablet Take 5 mg by mouth at bedtime.    Yes [provider]  oxyCODONE-acetaminophen (PERCOCET/ROXICET) 5-325 MG tablet Take 1-2 tablets by mouth every 4 (four) hours as needed for severe pain.   Yes [provider]  polyethylene glycol  (MIRALAX / GLYCOLAX) packet Take 17 g by mouth every other day.   Yes [provider]  ranitidine (ZANTAC) 150 MG tablet Take 150 mg by mouth at bedtime.   Yes [provider]  rosuvastatin (CRESTOR) 20 MG tablet Take 20 mg by mouth at bedtime.    Yes [provider]  spironolactone (ALDACTONE) 50 MG tablet Take 50 mg by mouth daily.   Yes [provider]  telmisartan (MICARDIS) 80 MG tablet Take 80 mg by mouth daily.   Yes [provider]  Vitamin D, Ergocalciferol, (DRISDOL) 50000 units CAPS capsule Take 50,000 Units by mouth every Sunday.   Yes [provider]       Physical Exam: BP (!) 247/75 (BP Location: Right Arm)   Pulse 85   Temp 98.7 F (37.1 C) (Oral)   Resp 14   Ht 5' (1.524 m)   Wt 68.2 kg (150 lb 5.7 oz)   SpO2 100%   BMI 29.36 kg/m  General appearance: Well-developed, adult female, alert and in mild distress from pain in back.   Eyes: Anicteric, conjunctiva pink, lids and lashes normal. PERRL.    ENT: No nasal deformity, discharge, epistaxis.  Hearing normal. OP moist without lesions.   Neck: No neck masses.  Trachea midline.  No thyromegaly/tenderness. Lymph: No cervical or supraclavicular lymphadenopathy. Skin: Warm and dry.  No suspicious rashes or lesions. Cardiac: RRR, nl S1-S2, systolic murmur.  Capillary refill is brisk.  No JVD.  No LE edema.  Radial and DP pulses 2+ and symmetric. Respiratory: Normal respiratory rate and rhythm.  No wheezes.  Crackles at bases. Abdomen: Abdomen soft.  No TTP. No ascites, distension, hepatosplenomegaly.   MSK: No deformities or effusions.  No cyanosis or clubbing.  No tenderness to palpation over T7, nor over paraspinals. Neuro: Cranial nerves normal.  Sensation intact to light touch. Speech is fluent.  Muscle strength 5/5 and symmetric.    Psych: Sensorium intact and responding to questions, attention normal.  Behavior appropriate.  Affect normal.  Judgment and insight  appear normal.     Labs on Admission:  I have personally reviewed following labs and imaging studies: CBC:  Recent Labs Lab 03/31/17 2216  WBC 9.6  NEUTROABS 6.0  HGB 9.5*  HCT 28.1*  MCV 89.5  PLT 191   Basic Metabolic Panel:  Recent Labs Lab 03/31/17 2216  NA 139  K 4.6  CL 100*  CO2 29  GLUCOSE 117*  BUN 20  CREATININE 3.86*  CALCIUM 9.7   GFR: Estimated Creatinine Clearance: 11.9 mL/min (A) (by C-G formula based on SCr of 3.86 mg/dL (H)).       Radiological Exams on Admission: Personally reviewed CTA report: Ct Angio Chest/abd/pel For Dissection W And/or Wo Contrast  Result Date: 04/01/2017 CLINICAL DATA:  Back  pain for 1 week. Patient with chronic renal failure on dialysis, most recently this morning. EXAM: CT ANGIOGRAPHY CHEST, ABDOMEN AND PELVIS TECHNIQUE: Multidetector CT imaging through the chest, abdomen and pelvis was performed using the standard protocol during bolus administration of intravenous contrast. Multiplanar reconstructed images and MIPs were obtained and reviewed to evaluate the vascular anatomy. CONTRAST:  100 cc Isovue 370 IV COMPARISON:  None.  No prior imaging available. FINDINGS: CTA CHEST FINDINGS Cardiovascular: Atherosclerosis of thoracic aorta without dissection, aneurysm or perihepatic hematoma. Cardiac motion artifact partially obscures detailed evaluation. No central pulmonary embolus. Multi chamber cardiomegaly. Moderate pericardial effusion adjacent to the left ventricle measures up to 27 mm. There are coronary artery calcifications. Mediastinum/Nodes: No mediastinal or hilar adenopathy. The esophagus is decompressed. Thyroid gland is heterogeneous. Lungs/Pleura: Linear atelectasis in the right lower and middle lobes. Compressive atelectasis in the left lower lobe adjacent to pericardial fluid. No consolidation. No pulmonary edema. No pleural effusion. Musculoskeletal: Degenerative disc disease in the thoracic spine. Mild anterior wedging  of T7 vertebral body with depression of the superior endplate. Loss of height loss in 20%. Sternum and ribs are intact. Review of the MIP images confirms the above findings. CTA ABDOMEN AND PELVIS FINDINGS VASCULAR Aorta: Normal caliber aorta without aneurysm, dissection, vasculitis or significant stenosis. Moderate diffuse atherosclerosis. Celiac: Minimal stenosis at the origins secondary to plaque. No evidence of aneurysm caught vasculitis or dissection. SMA: Patent without evidence of aneurysm, dissection, vasculitis or significant stenosis. Moderate diffuse atherosclerosis. Renals: Stent at the origin of the left renal artery. Two right renal arteries that are patent. IMA: Patent without evidence of aneurysm, dissection, vasculitis or significant stenosis. Inflow: Diffuse atheromatous plaque. No aneurysm or dissection. Narrowing at the bilateral common femoral artery is. Bilateral common femoral stents are partially included and not well evaluated. Veins: No obvious venous abnormality within the limitations of this arterial phase study. Review of the MIP images confirms the above findings. NON-VASCULAR Hepatobiliary: No focal hepatic lesion allowing for arterial phase enhancement. Gallstones within physiologically distended gallbladder. No biliary dilatation. Pancreas: Parenchymal atrophy. No ductal dilatation or inflammation. Spleen: Normal arterial phase enhancement. Adrenals/Urinary Tract: No adrenal nodule. Atrophic bilateral renal parenchyma, patient on hemodialysis. There is a 2.1 cm cyst in the mid right kidney. Heterogeneous left kidney likely due to small cysts. Urinary bladder is physiologically distended. Stomach/Bowel: Colonic diverticulosis without acute inflammation. No bowel obstruction. Appendix is not visualized. Questionable enteric sutures in the central small bowel. Lymphatic: No adenopathy. Reproductive: Uterus is surgically absent. Question of cystic changes in both adnexa, measuring up to  5 cm on the right and 3 cm on the left. Other: No ascites. No free air. Postsurgical change in the anterior abdominal wall. Musculoskeletal: Degenerative change in the lumbar spine without acute abnormality. Review of the MIP images confirms the above findings. IMPRESSION: 1. No aortic dissection or acute aortic abnormality. Diffuse atheromatous calcifications throughout the thoracic and abdominal aorta. 2. Minimal anterior wedge compression fracture of T7 vertebral body, age indeterminate, may be cause of patient's back pain. 3. Cardiomegaly with moderate pericardial effusion. Coronary artery calcifications. 4. Uncomplicated cholelithiasis. 5. Cystic changes in both adnexa, measuring up to 5 cm on the right and 3 cm on the left. Recommend nonemergent characterization with ultrasound on an elective basis. Comparison with any prior imaging if available may be helpful if available to evaluate for imaging stability. 6. Colonic diverticulosis. Aortic Atherosclerosis (ICD10-I70.0). Electronically Signed   By: Rubye Oaks M.D.   On: 04/01/2017 00:21  EKG: Independently reviewed. Rate 62, QTc 441, normal sinus rhythm, no ST changes.  [facility]ch[facility]ogram in [facility]: Report reviewed EF normal Mild AS Mild MR, TR, PR         Assessment/Plan  1. Hypertensive urgency:  Has baseline 6 meds for BP, in setting of ESRD.  Reports no missed HD recently, nor any symptoms of chest pain, dyspnea, chest congestion, swelling.  ?SBP related to pain.  Also notably wide PP in this elderly female with diffusely calc arteries.   -Restart orals:  ARB, Nifedipine XL, spironolactone, Imdur -Continue carvedilol, furosemide given in ER -Ensure adequate pain relief -Could trial oral hydralazine if trops normal and above measures fail   2. Elevated troponin:  No chest pain, elevation in ESRD unlikely to be significant. -Trend troponin  3. Back pain:  T7 compresion fracture. -Continue home  Percocet -Likely will need short refill until able to get home on Sunday  4. Adnexal cyst:  -Outpatient follow up ultrasound per PCP  5. Cerebrovascular disease secondary prevention:  -Continue Plavix, Crestor, aspirin      DVT prophylaxis: Lovenox  Code Status: FULL  Family Communication: None present  Disposition Plan: Anticipate trend troponins overnight.  If trend flat, discharge home with home BP meds. Consults called: None Admission status: OBS At the point of initial evaluation, it is my clinical opinion that admission for OBSERVATION is reasonable and necessary because the patient's presenting complaints in the context of their chronic conditions represent sufficient risk of deterioration or significant morbidity to constitute reasonable grounds for close observation in the hospital setting, but that the patient may be medically stable for discharge from the hospital within 24 to 48 hours.    Medical decision making: Patient seen at 3:15 AM on 04/01/2017.  The patient was discussed with Dierdre Forth, PA-C.  What exists of the patient's chart was reviewed in depth and summarized above.  Clinical condition: stable.        Alberteen Sam [facility] Pager [phone]

## 2018-07-11 IMAGING — CT CT ANGIO CHEST-ABD-PELV FOR DISSECTION W/ AND WO/W CM
2 of 7 series · 14 of 46 positions shown, 16 images · IV contrast (ISOVUE 370)
Comparison: None.  No prior imaging available.

CLINICAL DATA: Back pain for 1 week. Patient with chronic renal
failure on dialysis, most recently this morning.

EXAM:
CT ANGIOGRAPHY CHEST, ABDOMEN AND PELVIS
TECHNIQUE: Multidetector CT imaging through the chest, abdomen and pelvis was
performed using the standard protocol during bolus administration of
intravenous contrast. Multiplanar reconstructed images and MIPs were
obtained and reviewed to evaluate the vascular anatomy.
CONTRAST:  100 cc Isovue 370 IV

[Series 5: arterial 3.0 b30f · axial · arterial · 0.69mm/px · z∈[-527,-53]mm · 11 of 188 slices shown, 13 images]
[im 15/188  soft-tissue]
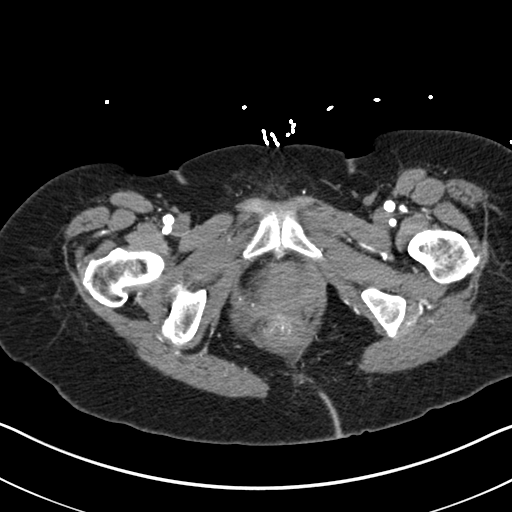
[im 15/188  bone]
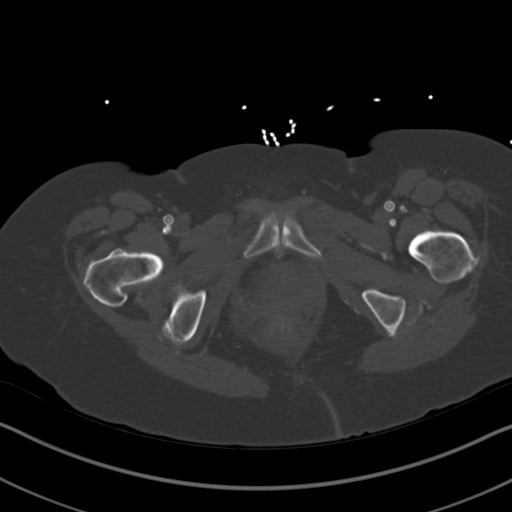
[im 29/188  soft-tissue]
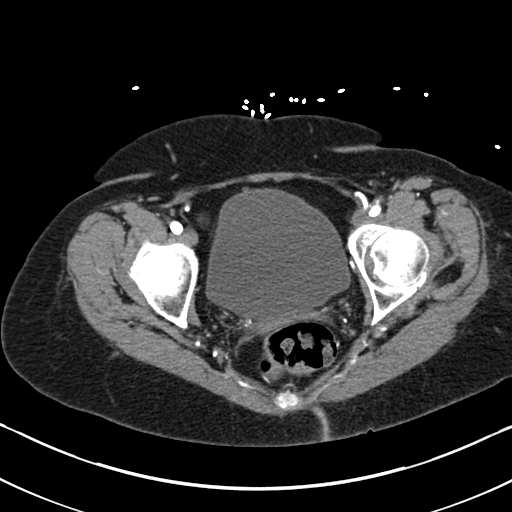
[im 44/188  soft-tissue]
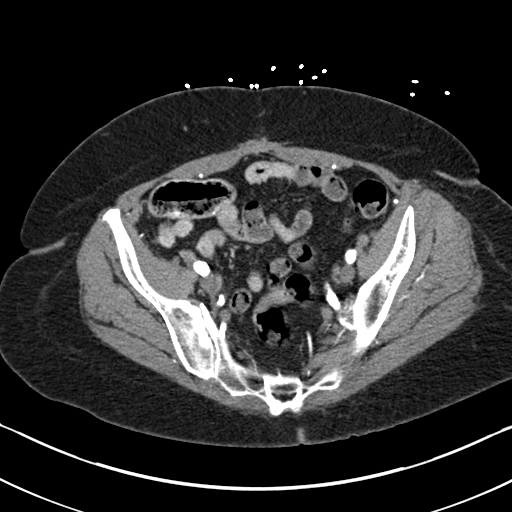
[im 58/188  soft-tissue]
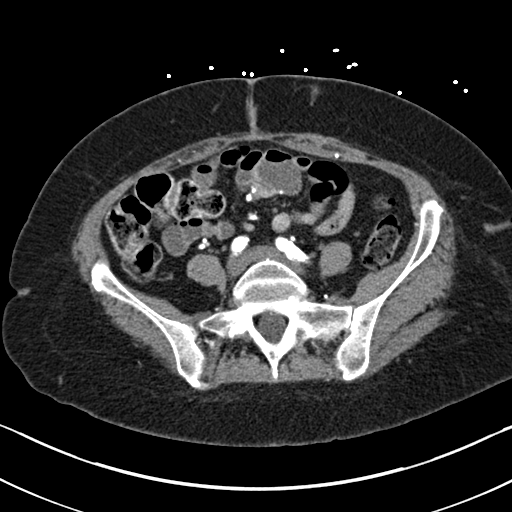
[im 72/188  soft-tissue]
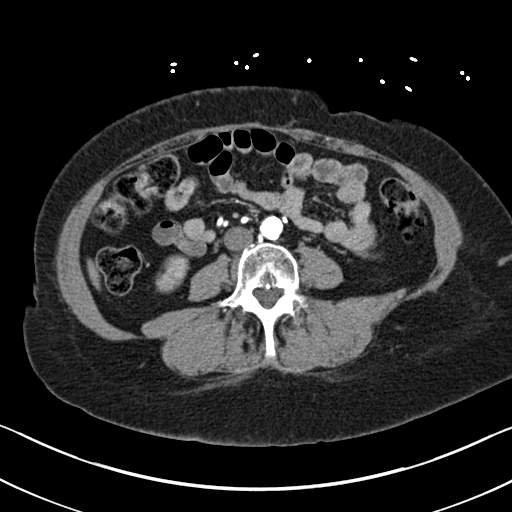
[im 101/188  soft-tissue]
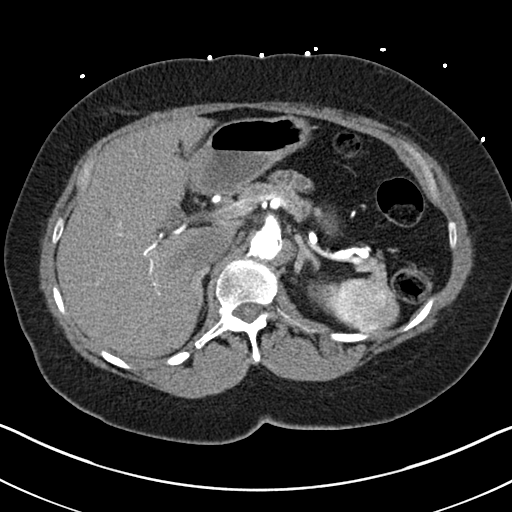
[im 116/188  soft-tissue]
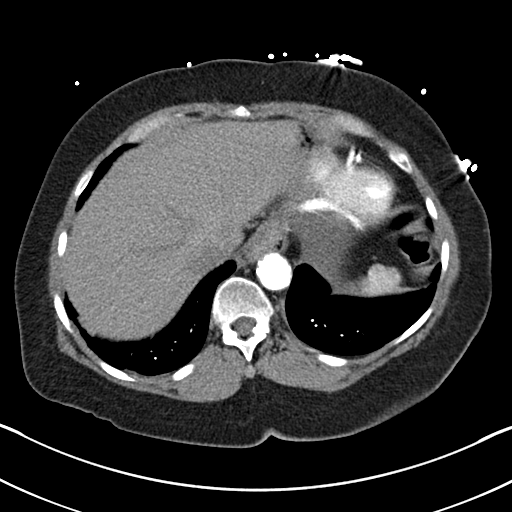
[im 130/188  soft-tissue]
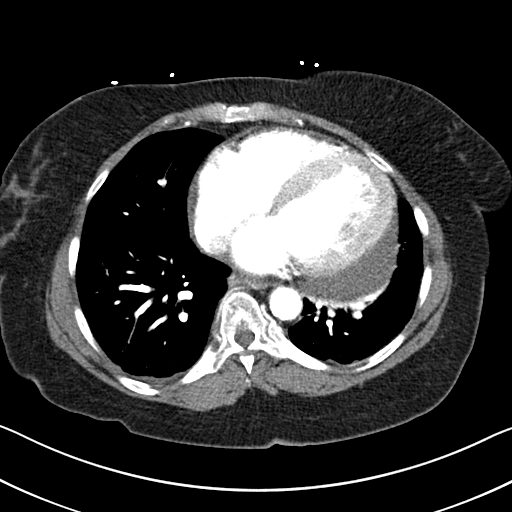
[im 144/188  soft-tissue]
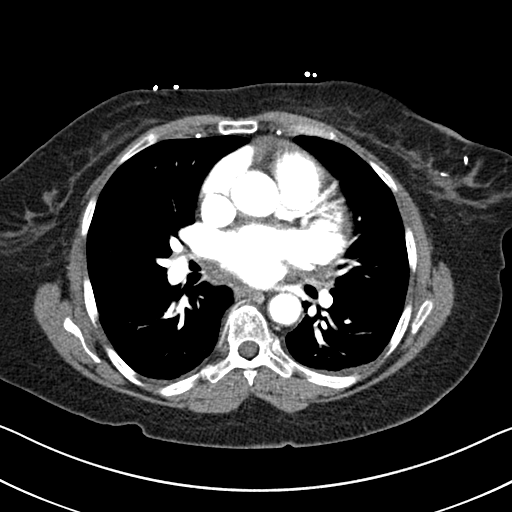
[im 144/188  bone]
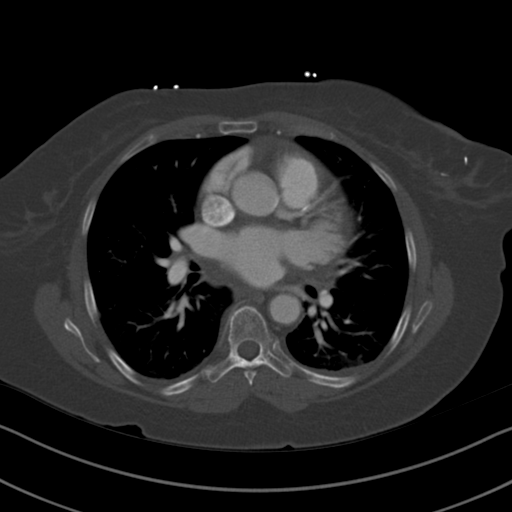
[im 159/188  soft-tissue]
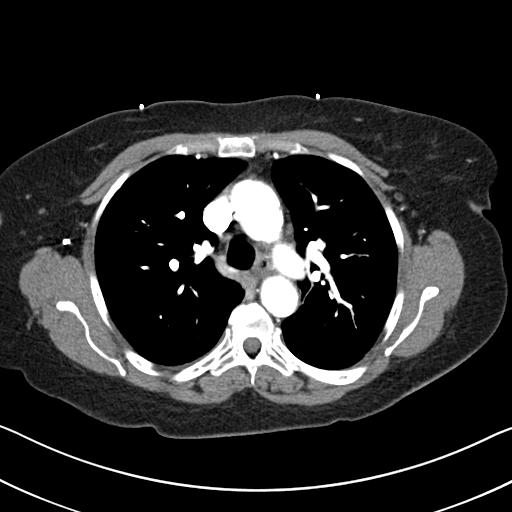
[im 173/188  soft-tissue]
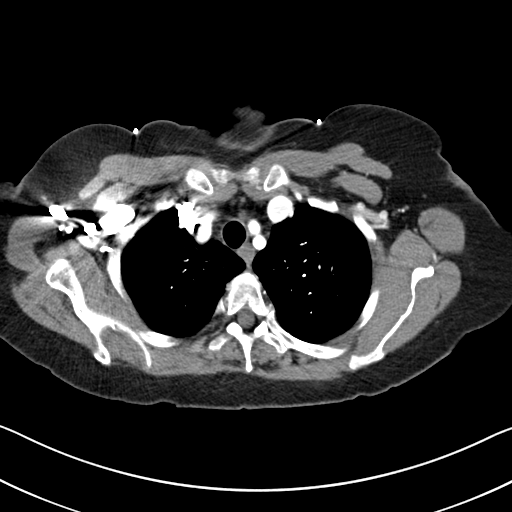

[Series 6: coronal mpr · coronal · 0.82mm/px · 3 of 126 slices shown]
[im 32/126  soft-tissue]
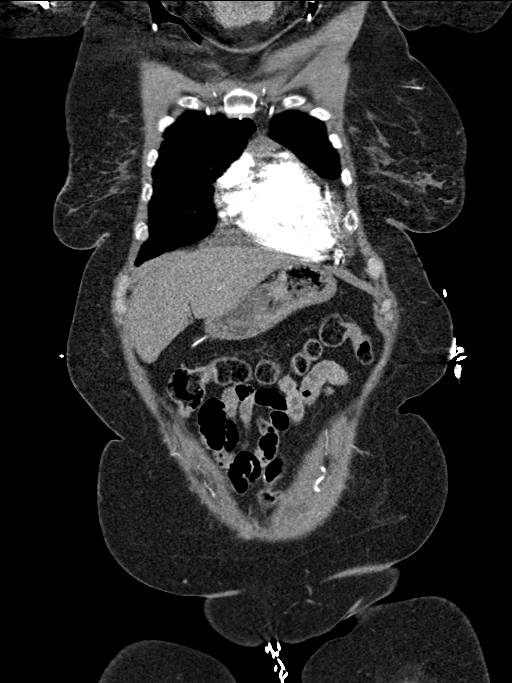
[im 63/126  soft-tissue]
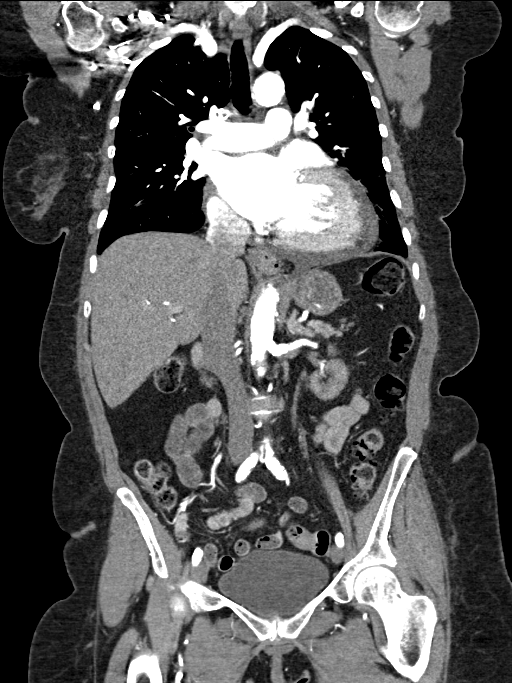
[im 94/126  soft-tissue]
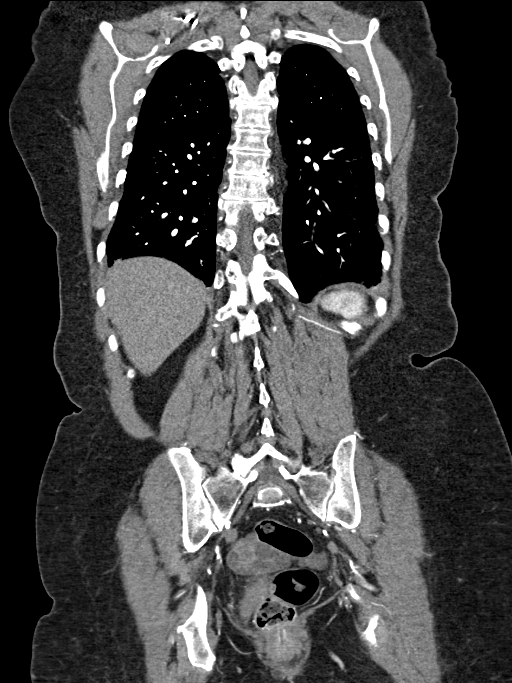

[14 of 46 positions shown; findings below may reference images not displayed]

FINDINGS: CTA CHEST FINDINGS

Cardiovascular: Atherosclerosis of thoracic aorta without
dissection, aneurysm or perihepatic hematoma. Cardiac motion
artifact partially obscures detailed evaluation. No central
pulmonary embolus. Multi chamber cardiomegaly. Moderate pericardial
effusion adjacent to the left ventricle measures up to 27 mm. There
are coronary artery calcifications.

Mediastinum/Nodes: No mediastinal or hilar adenopathy. The esophagus
is decompressed. Thyroid gland is heterogeneous.

Lungs/Pleura: Linear atelectasis in the right lower and middle
lobes. Compressive atelectasis in the left lower lobe adjacent to
pericardial fluid. No consolidation. No pulmonary edema. No pleural
effusion.

Musculoskeletal: Degenerative disc disease in the thoracic spine.
Mild anterior wedging of T7 vertebral body with depression of the
superior endplate. Loss of height loss in 20%. Sternum and ribs are
intact.

Review of the MIP images confirms the above findings.

CTA ABDOMEN AND PELVIS FINDINGS

VASCULAR

Aorta: Normal caliber aorta without aneurysm, dissection, vasculitis
or significant stenosis. Moderate diffuse atherosclerosis.

Celiac: Minimal stenosis at the origins secondary to plaque. No
evidence of aneurysm caught vasculitis or dissection.

SMA: Patent without evidence of aneurysm, dissection, vasculitis or
significant stenosis. Moderate diffuse atherosclerosis.

Renals: Stent at the origin of the left renal artery. Two right
renal arteries that are patent.

IMA: Patent without evidence of aneurysm, dissection, vasculitis or
significant stenosis.

Inflow: Diffuse atheromatous plaque. No aneurysm or dissection.
Narrowing at the bilateral common femoral artery is. Bilateral
common femoral stents are partially included and not well evaluated.

Veins: No obvious venous abnormality within the limitations of this
arterial phase study.

Review of the MIP images confirms the above findings.

NON-VASCULAR

Hepatobiliary: No focal hepatic lesion allowing for arterial phase
enhancement. Gallstones within physiologically distended
gallbladder. No biliary dilatation.

Pancreas: Parenchymal atrophy. No ductal dilatation or inflammation.

Spleen: Normal arterial phase enhancement.

Adrenals/Urinary Tract: No adrenal nodule. Atrophic bilateral renal
parenchyma, patient on hemodialysis. There is a 2.1 cm cyst in the
mid right kidney. Heterogeneous left kidney likely due to small
cysts. Urinary bladder is physiologically distended.

Stomach/Bowel: Colonic diverticulosis without acute inflammation. No
bowel obstruction. Appendix is not visualized. Questionable enteric
sutures in the central small bowel.

Lymphatic: No adenopathy.

Reproductive: Uterus is surgically absent. Question of cystic
changes in both adnexa, measuring up to 5 cm on the right and 3 cm
on the left.

Other: No ascites. No free air. Postsurgical change in the anterior
abdominal wall.

Musculoskeletal: Degenerative change in the lumbar spine without
acute abnormality.

Review of the MIP images confirms the above findings.
IMPRESSION: 1. No aortic dissection or acute aortic abnormality. Diffuse
atheromatous calcifications throughout the thoracic and abdominal
aorta.
2. Minimal anterior wedge compression fracture of T7 vertebral body,
age indeterminate, may be cause of patient's back pain.
3. Cardiomegaly with moderate pericardial effusion. Coronary artery
calcifications.
4. Uncomplicated cholelithiasis.
5. Cystic changes in both adnexa, measuring up to 5 cm on the right
and 3 cm on the left. Recommend nonemergent characterization with
ultrasound on an elective basis. Comparison with any prior imaging
if available may be helpful if available to evaluate for imaging
stability.
6. Colonic diverticulosis.
Aortic Atherosclerosis ([id number]).

## 2021-02-07 DEATH — deceased
